# Patient Record
Sex: Male | Born: 1985
Health system: Southern US, Community
[De-identification: ages and names within clinical notes are randomized; demographics above are authoritative.]

## PROBLEM LIST (undated history)

## (undated) DIAGNOSIS — R42 Dizziness and giddiness: Secondary | ICD-10-CM

## (undated) DIAGNOSIS — I10 Essential (primary) hypertension: Secondary | ICD-10-CM

## (undated) HISTORY — DX: Dizziness and giddiness: R42

## (undated) HISTORY — DX: Essential (primary) hypertension: I10

---

## 2014-06-14 HISTORY — PX: PILONIDAL CYST EXCISION: SHX744

## 2017-01-27 DIAGNOSIS — Z6831 Body mass index (BMI) 31.0-31.9, adult: Secondary | ICD-10-CM | POA: Diagnosis not present

## 2017-01-27 DIAGNOSIS — I10 Essential (primary) hypertension: Secondary | ICD-10-CM | POA: Diagnosis not present

## 2017-02-28 DIAGNOSIS — F17210 Nicotine dependence, cigarettes, uncomplicated: Secondary | ICD-10-CM | POA: Diagnosis not present

## 2017-02-28 DIAGNOSIS — I10 Essential (primary) hypertension: Secondary | ICD-10-CM | POA: Diagnosis not present

## 2017-06-30 DIAGNOSIS — R079 Chest pain, unspecified: Secondary | ICD-10-CM | POA: Diagnosis not present

## 2017-08-02 DIAGNOSIS — Z8249 Family history of ischemic heart disease and other diseases of the circulatory system: Secondary | ICD-10-CM | POA: Diagnosis not present

## 2017-08-02 DIAGNOSIS — R5383 Other fatigue: Secondary | ICD-10-CM | POA: Diagnosis not present

## 2017-08-02 DIAGNOSIS — Z720 Tobacco use: Secondary | ICD-10-CM | POA: Diagnosis not present

## 2017-08-02 DIAGNOSIS — I10 Essential (primary) hypertension: Secondary | ICD-10-CM | POA: Diagnosis not present

## 2017-08-12 DIAGNOSIS — R5383 Other fatigue: Secondary | ICD-10-CM | POA: Diagnosis not present

## 2017-08-12 DIAGNOSIS — Z8249 Family history of ischemic heart disease and other diseases of the circulatory system: Secondary | ICD-10-CM | POA: Diagnosis not present

## 2017-08-12 DIAGNOSIS — I10 Essential (primary) hypertension: Secondary | ICD-10-CM | POA: Diagnosis not present

## 2017-08-24 ENCOUNTER — Encounter: Payer: Self-pay | Admitting: *Deleted

## 2017-09-08 DIAGNOSIS — I10 Essential (primary) hypertension: Secondary | ICD-10-CM | POA: Diagnosis not present

## 2017-09-08 DIAGNOSIS — R42 Dizziness and giddiness: Secondary | ICD-10-CM | POA: Diagnosis not present

## 2017-09-08 DIAGNOSIS — R0789 Other chest pain: Secondary | ICD-10-CM | POA: Diagnosis not present

## 2017-09-16 DIAGNOSIS — I10 Essential (primary) hypertension: Secondary | ICD-10-CM | POA: Diagnosis not present

## 2017-09-16 DIAGNOSIS — R42 Dizziness and giddiness: Secondary | ICD-10-CM | POA: Diagnosis not present

## 2017-09-29 DIAGNOSIS — R079 Chest pain, unspecified: Secondary | ICD-10-CM | POA: Diagnosis not present

## 2017-09-29 DIAGNOSIS — I10 Essential (primary) hypertension: Secondary | ICD-10-CM | POA: Diagnosis not present

## 2017-09-29 DIAGNOSIS — R42 Dizziness and giddiness: Secondary | ICD-10-CM | POA: Diagnosis not present

## 2017-10-11 ENCOUNTER — Encounter: Payer: Self-pay | Admitting: *Deleted

## 2017-10-11 ENCOUNTER — Encounter: Payer: Self-pay | Admitting: Neurology

## 2017-10-11 ENCOUNTER — Encounter

## 2017-10-11 ENCOUNTER — Ambulatory Visit (INDEPENDENT_AMBULATORY_CARE_PROVIDER_SITE_OTHER): Payer: BLUE CROSS/BLUE SHIELD | Admitting: Neurology

## 2017-10-11 DIAGNOSIS — R42 Dizziness and giddiness: Secondary | ICD-10-CM | POA: Insufficient documentation

## 2017-10-11 MED ORDER — NORTRIPTYLINE HCL 25 MG PO CAPS: 50.0000 mg | ORAL_CAPSULE | Freq: Every day | ORAL | 11 refills | Status: DC

## 2017-10-11 NOTE — Progress Notes (Signed)
PATIENT: Nathan Warner DOB: 10-18-1985  Chief Complaint  Patient presents with  . Dizziness    Orthostatic Vitals:  Lying: 129/81, 76, Sitting: 149/89, 71, Standing: 146/89, 67, Standing x 3 mintues: 147/90, 72.  He is here with his wife, Nathan Warner.  Reports intermittent dizziness/lightheadedness since January 2019.  He has been doing the Epley Maneuver over the last week which has helped.  He only had mild relief with meclizine.  Marland Kitchen PCP    Via, Caryn Bee, MD     HISTORICAL  Nathan Warner is a 32 years old male, seen in refer by his primary care physician Nathan Warner, for evaluation of dizziness, initial evaluation was on April /30/2019.  He has past medical history of hypertension, is taking amlodipine 2.5 mg daily since April 2019.   He began to notice constellation of symptoms since September 2018, initially lightheaded, pressure sensation in his left chest, going down his left arm, he was diagnosed with hypertension was treated with hydrochlorothiazide, but by January 2019, he continued to be symptomatic, in addition he began to develop more almost daily constant low-grade dizziness, described as lightheaded sensation, occasionally feels spinning, has to set down resting, otherwise highly function, workout regularly, work, driving, continue his daily activity without limitations.  In addition, he complains of mild bilateral frontal pressure headaches, independent of his complaints of dizziness, he denies passing out, denies hearing loss,  Hydrochlorothiazide was stopped temporarily in April for 2 weeks, he felt great, but symptoms returned, he was started on amlodipine 2.5 mg since early April 2019, he did not feel a difference, remain symptomatic   Laboratory evaluation March 2019, normal sodium 138, hemoglobin of 15.7, CMP, with exception of mild elevated glucose 100, LDL of 83, total cholesterol of 143, TSH 1.72.   REVIEW OF SYSTEMS: Full 14 system review of systems performed and  notable only for anxiety, dizziness, headaches, blurred vision, spinning sensation, chest pain  ALLERGIES: No Known Allergies  HOME MEDICATIONS: Current Outpatient Medications  Medication Sig Dispense Refill  . amLODipine (NORVASC) 2.5 MG tablet Take 2.5 mg by mouth daily.  2  . omeprazole (PRILOSEC) 20 MG capsule Take 20 mg by mouth daily.  1   No current facility-administered medications for this visit.     PAST MEDICAL HISTORY: Past Medical History:  Diagnosis Date  . Hypertension   . Lightheadedness     PAST SURGICAL HISTORY: Past Surgical History:  Procedure Laterality Date  . PILONIDAL CYST EXCISION  2016    FAMILY HISTORY: Family History  Problem Relation Age of Onset  . Diabetes Mother   . Heart failure Mother   . Hypertension Father   . Skin cancer Father     SOCIAL HISTORY:  Social History   Socioeconomic History  . Marital status: Married    Spouse name: Not on file  . Number of children: 0  . Years of education: 33  . Highest education level: Master's degree (e.g., MA, MS, MEng, MEd, MSW, MBA)  Occupational History  . Occupation: Art gallery manager    Comment: [employer]   Social Needs  . Financial resource strain: Not on file  . Food insecurity:    Worry: Not on file    Inability: Not on file  . Transportation needs:    Medical: Not on file    Non-medical: Not on file  Tobacco Use  . Smoking status: Former Smoker    Types: Cigarettes    Last attempt to quit: 06/2017    Years since  quitting: 0.3  . Smokeless tobacco: Never Used  Substance and Sexual Activity  . Alcohol use: Yes    Comment: 10-15 beer/glass of wine per week  . Drug use: Never  . Sexual activity: Not on file  Lifestyle  . Physical activity:    Days per week: Not on file    Minutes per session: Not on file  . Stress: Not on file  Relationships  . Social connections:    Talks on phone: Not on file    Gets together: Not on file    Attends religious service: Not on file    Active  member of club or organization: Not on file    Attends meetings of clubs or organizations: Not on file    Relationship status: Not on file  . Intimate partner violence:    Fear of current or ex partner: Not on file    Emotionally abused: Not on file    Physically abused: Not on file    Forced sexual activity: Not on file  Other Topics Concern  . Not on file  Social History Narrative   Lives at home with his wife.   Right-handed.   1- cups coffee per day, occasional soda.     PHYSICAL EXAM   Vitals:   10/11/17 1007  Weight: 187 lb 12 oz (85.2 kg)  Height:  (1.676 m)    Not recorded      Body mass index is 30.3 kg/m.  PHYSICAL EXAMNIATION:  Gen: NAD, conversant, well nourised, obese, well groomed                     Cardiovascular: Regular rate rhythm, no peripheral edema, warm, nontender. Eyes: Conjunctivae clear without exudates or hemorrhage Neck: Supple, no carotid bruits. Pulmonary: Clear to auscultation bilaterally   NEUROLOGICAL EXAM:  MENTAL STATUS: Speech:    Speech is normal; fluent and spontaneous with normal comprehension.  Cognition:     Orientation to time, place and person     Normal recent and remote memory     Normal Attention span and concentration     Normal Language, naming, repeating,spontaneous speech     Fund of knowledge   CRANIAL NERVES: CN II: Visual fields are full to confrontation. Fundoscopic exam is normal with sharp discs and no vascular changes. Pupils are round equal and briskly reactive to light. CN III, IV, VI: extraocular movement are normal. No ptosis. CN V: Facial sensation is intact to pinprick in all 3 divisions bilaterally. Corneal responses are intact.  CN VII: Face is symmetric with normal eye closure and smile. CN VIII: Hearing is normal to rubbing fingers CN IX, X: Palate elevates symmetrically. Phonation is normal. CN XI: Head turning and shoulder shrug are intact CN XII: Tongue is midline with normal  movements and no atrophy.  MOTOR: There is no pronator drift of out-stretched arms. Muscle bulk and tone are normal. Muscle strength is normal.  REFLEXES: Reflexes are 2+ and symmetric at the biceps, triceps, knees, and ankles. Plantar responses are flexor.  SENSORY: Intact to light touch, pinprick, positional sensation and vibratory sensation are intact in fingers and toes.  COORDINATION: Rapid alternating movements and fine finger movements are intact. There is no dysmetria on finger-to-nose and heel-knee-shin.    GAIT/STANCE: Posture is normal. Gait is steady with normal steps, base, arm swing, and turning. Heel and toe walking are normal. Tandem gait is normal.  Romberg is absent.   DIAGNOSTIC DATA (LABS, IMAGING, TESTING) -  I reviewed patient records, labs, notes, testing and imaging myself where available.   ASSESSMENT AND PLAN  Nathan Warner is a 32 y.o. male   Dizziness Chronic headaches Normal neurological examinations,  Extensive laboratory evaluations failed to demonstrate etiology He complains of anxiety, will try low-dose of nortriptyline 25 mg titrating to 50 mg every night   Nathan Warner, M.D. Ph.D.  [facility] [address] [place], [place] [address] Ph: [phone] Fax: [phone]  CC: Iva Boop, MD

## 2017-10-12 ENCOUNTER — Telehealth: Payer: Self-pay | Admitting: Neurology

## 2017-10-12 NOTE — Telephone Encounter (Signed)
Patient is calling.  He started Nortriptyline yesterday, went to gym today and his heart rate is very high

## 2017-10-12 NOTE — Telephone Encounter (Signed)
Per Dr. Terrace Arabia, stop nortriptyline.  She is not going to start another medication at this time.  She suggested just monitoring the symptoms.  He is agreeable to this plan. Stated he was already feeling better this afternoon and his heart rate had improved. He declined to make a follow up at this time.  He has an appt w/ his PCP in three weeks and said he would call us back if the PCP felt he needed to continue seeing a neurologist.

## 2017-10-12 NOTE — Addendum Note (Signed)
Addended by: Lindell Spar C on: 10/12/2017 01:38 PM   Modules accepted: Orders

## 2017-10-12 NOTE — Telephone Encounter (Signed)
Spoke to patient - he took his first dose of nortriptyline  last night at bedtime.  He went to gym and completed his normal workout routine this morning.  He has been at work for three hours now (sitting at his desk) and his heart rate is still up.  Initially, it was in the 120s and now is fluctuating between 85-110.

## 2017-10-18 DIAGNOSIS — I10 Essential (primary) hypertension: Secondary | ICD-10-CM | POA: Diagnosis not present

## 2017-10-18 DIAGNOSIS — R51 Headache: Secondary | ICD-10-CM | POA: Diagnosis not present

## 2017-10-18 DIAGNOSIS — R42 Dizziness and giddiness: Secondary | ICD-10-CM | POA: Diagnosis not present

## 2017-10-18 DIAGNOSIS — R202 Paresthesia of skin: Secondary | ICD-10-CM | POA: Diagnosis not present

## 2017-10-24 DIAGNOSIS — R42 Dizziness and giddiness: Secondary | ICD-10-CM | POA: Diagnosis not present

## 2017-10-24 DIAGNOSIS — R51 Headache: Secondary | ICD-10-CM | POA: Diagnosis not present

## 2017-10-24 DIAGNOSIS — Z135 Encounter for screening for eye and ear disorders: Secondary | ICD-10-CM | POA: Diagnosis not present

## 2017-11-04 DIAGNOSIS — R42 Dizziness and giddiness: Secondary | ICD-10-CM | POA: Diagnosis not present

## 2017-11-04 DIAGNOSIS — R0789 Other chest pain: Secondary | ICD-10-CM | POA: Diagnosis not present

## 2017-11-04 DIAGNOSIS — I10 Essential (primary) hypertension: Secondary | ICD-10-CM | POA: Diagnosis not present

## 2017-11-14 DIAGNOSIS — R0609 Other forms of dyspnea: Secondary | ICD-10-CM | POA: Diagnosis not present

## 2017-11-22 DIAGNOSIS — R079 Chest pain, unspecified: Secondary | ICD-10-CM | POA: Diagnosis not present

## 2017-12-05 ENCOUNTER — Ambulatory Visit: Payer: Self-pay | Admitting: Family Medicine

## 2017-12-06 DIAGNOSIS — J320 Chronic maxillary sinusitis: Secondary | ICD-10-CM | POA: Diagnosis not present

## 2017-12-06 DIAGNOSIS — H9313 Tinnitus, bilateral: Secondary | ICD-10-CM | POA: Diagnosis not present

## 2017-12-06 DIAGNOSIS — H8143 Vertigo of central origin, bilateral: Secondary | ICD-10-CM | POA: Diagnosis not present

## 2017-12-06 DIAGNOSIS — J301 Allergic rhinitis due to pollen: Secondary | ICD-10-CM | POA: Diagnosis not present

## 2017-12-06 DIAGNOSIS — H6121 Impacted cerumen, right ear: Secondary | ICD-10-CM | POA: Diagnosis not present

## 2017-12-06 DIAGNOSIS — J3081 Allergic rhinitis due to animal (cat) (dog) hair and dander: Secondary | ICD-10-CM | POA: Diagnosis not present

## 2017-12-23 DIAGNOSIS — J040 Acute laryngitis: Secondary | ICD-10-CM | POA: Diagnosis not present

## 2017-12-23 DIAGNOSIS — J320 Chronic maxillary sinusitis: Secondary | ICD-10-CM | POA: Diagnosis not present

## 2017-12-23 DIAGNOSIS — J410 Simple chronic bronchitis: Secondary | ICD-10-CM | POA: Diagnosis not present

## 2017-12-23 DIAGNOSIS — J322 Chronic ethmoidal sinusitis: Secondary | ICD-10-CM | POA: Diagnosis not present

## 2017-12-27 DIAGNOSIS — J322 Chronic ethmoidal sinusitis: Secondary | ICD-10-CM | POA: Diagnosis not present

## 2017-12-27 DIAGNOSIS — H8143 Vertigo of central origin, bilateral: Secondary | ICD-10-CM | POA: Diagnosis not present

## 2017-12-27 DIAGNOSIS — J320 Chronic maxillary sinusitis: Secondary | ICD-10-CM | POA: Diagnosis not present

## 2017-12-27 DIAGNOSIS — J301 Allergic rhinitis due to pollen: Secondary | ICD-10-CM | POA: Diagnosis not present

## 2018-01-10 DIAGNOSIS — H9041 Sensorineural hearing loss, unilateral, right ear, with unrestricted hearing on the contralateral side: Secondary | ICD-10-CM | POA: Diagnosis not present

## 2018-01-10 DIAGNOSIS — H8141 Vertigo of central origin, right ear: Secondary | ICD-10-CM | POA: Diagnosis not present

## 2018-02-01 DIAGNOSIS — I10 Essential (primary) hypertension: Secondary | ICD-10-CM | POA: Diagnosis not present

## 2018-02-01 DIAGNOSIS — R202 Paresthesia of skin: Secondary | ICD-10-CM | POA: Diagnosis not present

## 2018-04-12 DIAGNOSIS — G43909 Migraine, unspecified, not intractable, without status migrainosus: Secondary | ICD-10-CM | POA: Diagnosis not present

## 2018-04-12 DIAGNOSIS — H8111 Benign paroxysmal vertigo, right ear: Secondary | ICD-10-CM | POA: Diagnosis not present

## 2018-04-12 DIAGNOSIS — H9041 Sensorineural hearing loss, unilateral, right ear, with unrestricted hearing on the contralateral side: Secondary | ICD-10-CM | POA: Diagnosis not present

## 2018-04-12 DIAGNOSIS — H8101 Meniere's disease, right ear: Secondary | ICD-10-CM | POA: Diagnosis not present

## 2018-06-01 DIAGNOSIS — Z79899 Other long term (current) drug therapy: Secondary | ICD-10-CM | POA: Diagnosis not present

## 2018-06-01 DIAGNOSIS — K219 Gastro-esophageal reflux disease without esophagitis: Secondary | ICD-10-CM | POA: Diagnosis not present

## 2018-06-01 DIAGNOSIS — R42 Dizziness and giddiness: Secondary | ICD-10-CM | POA: Diagnosis not present

## 2018-06-01 DIAGNOSIS — I10 Essential (primary) hypertension: Secondary | ICD-10-CM | POA: Diagnosis not present

## 2018-06-12 DIAGNOSIS — Z049 Encounter for examination and observation for unspecified reason: Secondary | ICD-10-CM | POA: Diagnosis not present

## 2018-06-12 DIAGNOSIS — G44229 Chronic tension-type headache, not intractable: Secondary | ICD-10-CM | POA: Diagnosis not present

## 2019-03-18 DIAGNOSIS — Z20828 Contact with and (suspected) exposure to other viral communicable diseases: Secondary | ICD-10-CM | POA: Diagnosis not present

## 2019-04-16 DIAGNOSIS — M79674 Pain in right toe(s): Secondary | ICD-10-CM | POA: Diagnosis not present

## 2019-04-18 DIAGNOSIS — M79674 Pain in right toe(s): Secondary | ICD-10-CM | POA: Diagnosis not present

## 2019-08-08 DIAGNOSIS — M79674 Pain in right toe(s): Secondary | ICD-10-CM | POA: Diagnosis not present

## 2019-08-20 ENCOUNTER — Encounter: Payer: Self-pay | Admitting: Family Medicine

## 2019-08-20 ENCOUNTER — Ambulatory Visit (INDEPENDENT_AMBULATORY_CARE_PROVIDER_SITE_OTHER): Payer: BC Managed Care – PPO | Admitting: Family Medicine

## 2019-08-20 ENCOUNTER — Ambulatory Visit: Payer: Self-pay

## 2019-08-20 ENCOUNTER — Other Ambulatory Visit: Payer: Self-pay

## 2019-08-20 DIAGNOSIS — M79674 Pain in right toe(s): Secondary | ICD-10-CM

## 2019-08-20 MED ORDER — DICLOFENAC SODIUM 1 % EX GEL: 4.0000 g | Freq: Four times a day (QID) | CUTANEOUS | 6 refills | Status: AC | PRN

## 2019-08-20 MED ORDER — DICLOFENAC SODIUM 75 MG PO TBEC: 75.0000 mg | DELAYED_RELEASE_TABLET | Freq: Two times a day (BID) | ORAL | 3 refills | Status: AC | PRN

## 2019-08-20 NOTE — Progress Notes (Signed)
Office Visit Note   Patient: Nathan Warner           Date of Birth: [birth year]           MRN: [id number] Visit Date: 08/20/2019 Requested by: Dineen Kid, MD [place],  [place] [address] PCP: Dineen Kid, MD  Subjective: Chief Complaint  Patient presents with  . Left Foot - Pain    Pain around first metatarsal - started 2 days after stubbing toe late Oct/early Nov 2020. Has been to an orthopedist, but the patient was not pleased with how he was treated. Xrays were normal, along with blood work.    HPI: He is here with right great toe pain.  About 3 or 4 months ago he stubbed his toe against something while working in the garage.  He did not have much pain right away, but about 3 days later it started to hurt and swell so he went to his PCP who took some x-rays and felt that he might have a fracture.  He was referred to orthopedics and was told that it was not a fracture but could be gout.  He was given indomethacin but it did not help.  He has continued to have pain with certain activities.  He walks with a slight limp.  Pain is in the great toe MTP joint.  It hurts mostly on the medial side.  No previous problems with his toe.  He is otherwise been in good health.  No personal or family history of gout.  He had labs showing a normal uric acid level.  He recently had follow-up x-rays which were reportedly normal.  I do not have any x-rays available for review.              ROS: No fevers or chills.  All other systems were reviewed and are negative.  Objective: Vital Signs: There were no vitals taken for this visit.  Physical Exam:  General:  Alert and oriented, in no acute distress. Pulm:  Breathing unlabored. Psy:  Normal mood, congruent affect. Skin: No erythema or rash. Right foot: His great toe MTP joint is tender to palpation on the dorsal and medial aspect.  There is very slight swelling but no warmth.  Dorsiflexion is about 45 degrees before it causes pain.  Flexor  and extensor mechanisms are intact.  Imaging: Limited diagnostic ultrasound right great toe: The MTP joint has some synovial thickening but no distinct effusion, no hyperemia on power Doppler imaging.  I question whether he has a double contour sign on the dorsal aspect of the distal first metatarsal articular cartilage.  Assessment & Plan: 1.  Persistent right first MTP pain approximately 3 months status post contusion, possible occult fracture versus sprain with persistent synovitis. -We will try combination of anti-inflammatories, vitamin D3, vitamin K2 and magnesium. -If after 3 to 4 weeks he is not improving, we might do a one-time cortisone injection.  If that does not help, then MRI scan.     Procedures: No procedures performed  No notes on file     PMFS History: Patient Active Problem List   Diagnosis Date Noted  . Dizziness 10/11/2017   Past Medical History:  Diagnosis Date  . Hypertension   . Lightheadedness     Family History  Problem Relation Age of Onset  . Diabetes Mother   . Heart failure Mother   . Hypertension Father   . Skin cancer Father     Past Surgical  History:  Procedure Laterality Date  . PILONIDAL CYST EXCISION  2016   Social History   Occupational History  . Occupation: Art gallery manager    Comment: [employer]   Tobacco Use  . Smoking status: Former Smoker    Types: Cigarettes    Quit date: 06/2017    Years since quitting: 2.1  . Smokeless tobacco: Never Used  Substance and Sexual Activity  . Alcohol use: Yes    Comment: 10-15 beer/glass of wine per week  . Drug use: Never  . Sexual activity: Not on file

## 2019-08-20 NOTE — Patient Instructions (Signed)
   Vitamin D3:  5,000 IU daily  Vitamin K2:  100 mcg daily  Magnesium:  400 mg daily   

## 2019-08-23 ENCOUNTER — Ambulatory Visit: Payer: BC Managed Care – PPO

## 2019-08-23 ENCOUNTER — Ambulatory Visit: Payer: BC Managed Care – PPO | Attending: Internal Medicine

## 2019-08-23 DIAGNOSIS — Z23 Encounter for immunization: Secondary | ICD-10-CM

## 2019-08-23 NOTE — Progress Notes (Signed)
   Covid-19 Vaccination Clinic  Name:  Nathan Warner    MRN: [id number] DOB: 12/12/1985  08/23/2019  Mr. Nathan Warner was observed post Covid-19 immunization for 15 minutes without incident. He was provided with Vaccine Information Sheet and instruction to access the V-Safe system.   Mr. Nathan Warner was instructed to call 911 with any severe reactions post vaccine: Marland Kitchen Difficulty breathing  . Swelling of face and throat  . A fast heartbeat  . A bad rash all over body  . Dizziness and weakness   Immunizations Administered    Name Date Dose VIS Date Route   Pfizer COVID-19 Vaccine 08/23/2019  8:48 AM 0.3 mL 05/25/2019 Intramuscular   Manufacturer: [employer], [employer]   Lot: [id number]   NDC: [address]-8

## 2019-09-17 ENCOUNTER — Ambulatory Visit: Payer: BC Managed Care – PPO | Attending: Internal Medicine

## 2019-09-17 DIAGNOSIS — Z23 Encounter for immunization: Secondary | ICD-10-CM

## 2019-09-17 NOTE — Progress Notes (Signed)
   Covid-19 Vaccination Clinic  Name:  Nathan Warner    MRN: [id number] DOB: 03-19-1986  09/17/2019  [name] was observed post Covid-19 immunization for 15 minutes without incident. He was provided with Vaccine Information Sheet and instruction to access the V-Safe system.   [name] was instructed to call 911 with any severe reactions post vaccine: Marland Kitchen Difficulty breathing  . Swelling of face and throat  . A fast heartbeat  . A bad rash all over body  . Dizziness and weakness   Immunizations Administered    Name Date Dose VIS Date Route   Pfizer COVID-19 Vaccine 09/17/2019  8:52 AM 0.3 mL 05/25/2019 Intramuscular   Manufacturer: [employer], [employer]   Lot: [id number]   NDC: [address]-6

## 2019-10-18 ENCOUNTER — Ambulatory Visit: Payer: Self-pay

## 2019-10-18 ENCOUNTER — Other Ambulatory Visit: Payer: Self-pay

## 2019-10-18 ENCOUNTER — Encounter: Payer: Self-pay | Admitting: Family Medicine

## 2019-10-18 ENCOUNTER — Ambulatory Visit (INDEPENDENT_AMBULATORY_CARE_PROVIDER_SITE_OTHER): Payer: BC Managed Care – PPO | Admitting: Family Medicine

## 2019-10-18 DIAGNOSIS — M79674 Pain in right toe(s): Secondary | ICD-10-CM

## 2019-10-18 MED ORDER — TRAMADOL HCL 50 MG PO TABS: 50.0000 mg | ORAL_TABLET | Freq: Four times a day (QID) | ORAL | 0 refills | Status: AC | PRN

## 2019-10-18 NOTE — Progress Notes (Signed)
   Office Visit Note   Patient: Nathan Warner           Date of Birth: [birth year]           MRN: [id number] Visit Date: 10/18/2019 Requested by: No referring provider defined for this encounter. PCP: Patient, No Pcp Per  Subjective: Chief Complaint  Patient presents with  . Right Great Toe - Pain    Pain went away x month, after taking prescribed medicine at last ov in March. Flared back up after working on the floor, kneeling.    HPI: Some right great toe pain.  Since last visit he took diclofenac and his pain improved, but the stiffness remains.  Then recently he was working on some floors at home and afterward his toe became red and swollen again.  He is frustrated by his ongoing symptoms.              ROS: No fever or chills.  All other systems were reviewed and are negative.  Objective: Vital Signs: There were no vitals taken for this visit.  Physical Exam:  General:  Alert and oriented, in no acute distress. Pulm:  Breathing unlabored. Psy:  Normal mood, congruent affect. Skin: There is some erythema on the medial side of his first MTP joint.  It is slightly warm to the touch. Right great toe: He has tenderness to palpation of the medial side of the first MTP.  Slightly limited range of motion with dorsiflexion.  Imaging: US Guided Needle Placement - No Linked Charges  Result Date: 10/18/2019 Ultrasound-guided right first MTP injection: I briefly image the dorsal medial MTP joint and there is slight hyperemia on power Doppler imaging with thickening of the joint capsule, I question whether he might have a double contour sign at the distal first metatarsal.  Injected 2 cc 1% lidocaine without epinephrine and 20 mg methylprednisolone into the dorsal medial joint recess.   Assessment & Plan: 1.  Persistent right first MTP pain, possibilities include crystal arthropathy, fracture nonunion. -We discussed options and elected to inject with cortisone under ultrasound guidance.  If  he fails to improve, then MRI scan.     Procedures: No procedures performed  No notes on file     PMFS History: Patient Active Problem List   Diagnosis Date Noted  . Dizziness 10/11/2017   Past Medical History:  Diagnosis Date  . Hypertension   . Lightheadedness     Family History  Problem Relation Age of Onset  . Diabetes Mother   . Heart failure Mother   . Hypertension Father   . Skin cancer Father     Past Surgical History:  Procedure Laterality Date  . PILONIDAL CYST EXCISION  2016   Social History   Occupational History  . Occupation: Art gallery manager    Comment: [employer]   Tobacco Use  . Smoking status: Former Smoker    Types: Cigarettes    Quit date: 06/2017    Years since quitting: 2.3  . Smokeless tobacco: Never Used  Substance and Sexual Activity  . Alcohol use: Yes    Comment: 10-15 beer/glass of wine per week  . Drug use: Never  . Sexual activity: Not on file

## 2019-12-12 ENCOUNTER — Encounter: Payer: Self-pay | Admitting: Family Medicine

## 2019-12-12 ENCOUNTER — Other Ambulatory Visit: Payer: Self-pay

## 2019-12-12 ENCOUNTER — Ambulatory Visit (INDEPENDENT_AMBULATORY_CARE_PROVIDER_SITE_OTHER): Payer: BC Managed Care – PPO | Admitting: Family Medicine

## 2019-12-12 DIAGNOSIS — M79674 Pain in right toe(s): Secondary | ICD-10-CM | POA: Diagnosis not present

## 2019-12-12 MED ORDER — COLCHICINE 0.6 MG PO CAPS: 1.0000 | ORAL_CAPSULE | Freq: Two times a day (BID) | ORAL | 3 refills | Status: AC | PRN

## 2019-12-12 NOTE — Progress Notes (Signed)
Office Visit Note   Patient: Nathan Warner           Date of Birth: 1986-05-04           MRN: [id number] Visit Date: 12/12/2019 Requested by: No referring provider defined for this encounter. PCP: Patient, No Pcp Per  Subjective: Chief Complaint  Patient presents with  . Right Foot - Pain    Pain in the first MTP joint had gotten better after cortisone injection 10/18/19. Pain returned yesterday, after much work in around cars [employer]). Did take indomethacin, which eased the throbbing. The area is red today.    HPI: He is here with recurrent right great toe pain.  The injection gave him excellent relief lasting for about 2 months.  He was not totally back to normal, he still felt some stiffness in his toe, but it was not hurting anymore.  A couple days ago he had a project at work which involved a lot of physical activity.  By the end of the day his toe was throbbing, red and swollen.  He took some indomethacin and his pain is better, but he is still walking with a limp.               ROS:   All other systems were reviewed and are negative.  Objective: Vital Signs: There were no vitals taken for this visit.  Physical Exam:  General:  Alert and oriented, in no acute distress. Pulm:  Breathing unlabored. Psy:  Normal mood, congruent affect. Skin: There is some erythema on the medial side of his first MTP joint of the right great toe. Right foot: Tender and a small effusion at the first MTP joint.  The IP joint does not hurt.  He has pain with dorsiflexion.  Imaging: No results found.  Assessment & Plan: 1.  Recurrent right first MTP pain, suspicious for gout. -We discussed options, elected to treat with colchicine and indomethacin.  If not significantly improved by Friday, he will come back and we will attempt to aspirate and send the fluid for crystal analysis.  Could do 1 more cortisone injection at that point. -If colchicine helps, then we will draw a uric acid level when  he is feeling better to see what his baseline level is and decide whether to consider preventive medication such as allopurinol.     Procedures: No procedures performed  No notes on file     PMFS History: Patient Active Problem List   Diagnosis Date Noted  . Dizziness 10/11/2017   Past Medical History:  Diagnosis Date  . Hypertension   . Lightheadedness     Family History  Problem Relation Age of Onset  . Diabetes Mother   . Heart failure Mother   . Hypertension Father   . Skin cancer Father     Past Surgical History:  Procedure Laterality Date  . PILONIDAL CYST EXCISION  2016   Social History   Occupational History  . Occupation: Art gallery manager    Comment: [employer]   Tobacco Use  . Smoking status: Former Smoker    Types: Cigarettes    Quit date: 06/2017    Years since quitting: 2.4  . Smokeless tobacco: Never Used  Vaping Use  . Vaping Use: Never used  Substance and Sexual Activity  . Alcohol use: Yes    Comment: 10-15 beer/glass of wine per week  . Drug use: Never  . Sexual activity: Not on file

## 2020-01-07 ENCOUNTER — Other Ambulatory Visit: Payer: Self-pay

## 2020-01-07 ENCOUNTER — Encounter: Payer: Self-pay | Admitting: Family Medicine

## 2020-01-07 ENCOUNTER — Ambulatory Visit (INDEPENDENT_AMBULATORY_CARE_PROVIDER_SITE_OTHER): Payer: BC Managed Care – PPO

## 2020-01-07 DIAGNOSIS — M79674 Pain in right toe(s): Secondary | ICD-10-CM

## 2020-01-07 NOTE — Progress Notes (Signed)
Lab visit only: uric acid drawn per Dr. Prince Rome.

## 2020-01-08 ENCOUNTER — Telehealth: Payer: Self-pay | Admitting: Family Medicine

## 2020-01-08 DIAGNOSIS — M79674 Pain in right toe(s): Secondary | ICD-10-CM

## 2020-01-08 LAB — URIC ACID: Uric Acid, Serum: 6.8 mg/dL (ref 4.0–8.0)

## 2020-01-08 NOTE — Addendum Note (Signed)
Addended by: Lillia Carmel on: 01/08/2020 11:38 AM   Modules accepted: Orders

## 2020-01-08 NOTE — Telephone Encounter (Signed)
Uric acid is 6.8.

## 2020-01-31 ENCOUNTER — Other Ambulatory Visit: Payer: Self-pay

## 2020-01-31 ENCOUNTER — Ambulatory Visit
Admission: RE | Admit: 2020-01-31 | Discharge: 2020-01-31 | Disposition: A | Discharge status: Home / Self Care | Payer: BC Managed Care – PPO | Source: Ambulatory Visit | Attending: Family Medicine | Admitting: Family Medicine

## 2020-01-31 DIAGNOSIS — M79674 Pain in right toe(s): Secondary | ICD-10-CM | POA: Diagnosis not present

## 2020-02-01 ENCOUNTER — Telehealth: Payer: Self-pay | Admitting: Family Medicine

## 2020-02-01 DIAGNOSIS — M79674 Pain in right toe(s): Secondary | ICD-10-CM

## 2020-02-01 NOTE — Addendum Note (Signed)
Addended by: Lillia Carmel on: 02/01/2020 02:38 PM   Modules accepted: Orders

## 2020-02-01 NOTE — Telephone Encounter (Signed)
MRI of right great toe looks normal.

## 2020-02-21 DIAGNOSIS — M79671 Pain in right foot: Secondary | ICD-10-CM | POA: Diagnosis not present

## 2020-03-04 DIAGNOSIS — M79671 Pain in right foot: Secondary | ICD-10-CM | POA: Diagnosis not present

## 2020-03-19 DIAGNOSIS — M79671 Pain in right foot: Secondary | ICD-10-CM | POA: Diagnosis not present

## 2020-04-26 DIAGNOSIS — J22 Unspecified acute lower respiratory infection: Secondary | ICD-10-CM | POA: Diagnosis not present

## 2020-04-26 DIAGNOSIS — R059 Cough, unspecified: Secondary | ICD-10-CM | POA: Diagnosis not present

## 2020-04-26 DIAGNOSIS — Z03818 Encounter for observation for suspected exposure to other biological agents ruled out: Secondary | ICD-10-CM | POA: Diagnosis not present

## 2021-05-15 IMAGING — MR MR FOOT*R* W/O CM
4 of 5 series · 19 of 40 positions shown · non-contrast
Comparison: Right foot x-rays dated August 08, 2019.

CLINICAL DATA: Right great toe pain for the past 8 months.

EXAM:
MRI OF THE RIGHT FOREFOOT WITHOUT CONTRAST
TECHNIQUE: Multiplanar, multisequence MR imaging of the right forefoot was
performed. No intravenous contrast was administered.

[Series 7: T1 · coronal · 3.0mm · 0.22mm/px · 3 of 44 slices shown (1 of 2)]
[im 5/44]
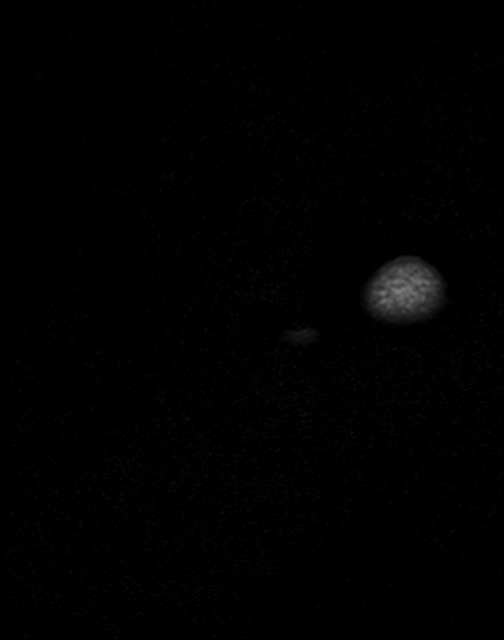
[im 22/44]
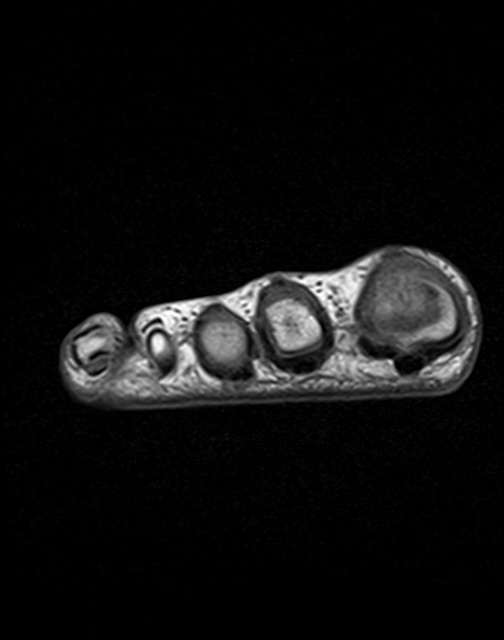
[im 39/44]
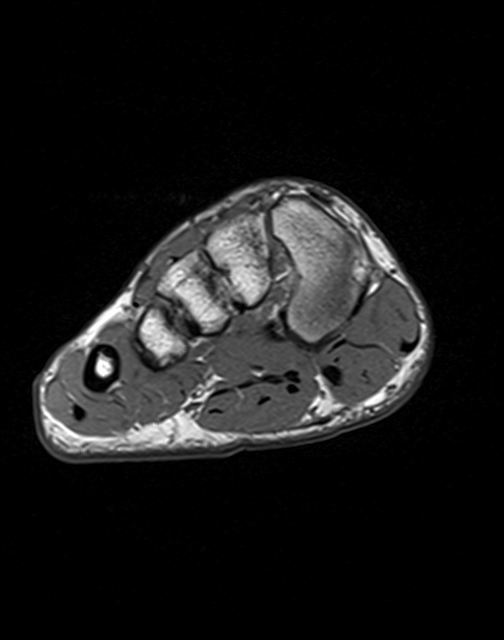

[Series 8: T2 fat-sat · coronal · 3.0mm · 0.20mm/px · 10 of 44 slices shown (1 of 2)]
[im 1/44]
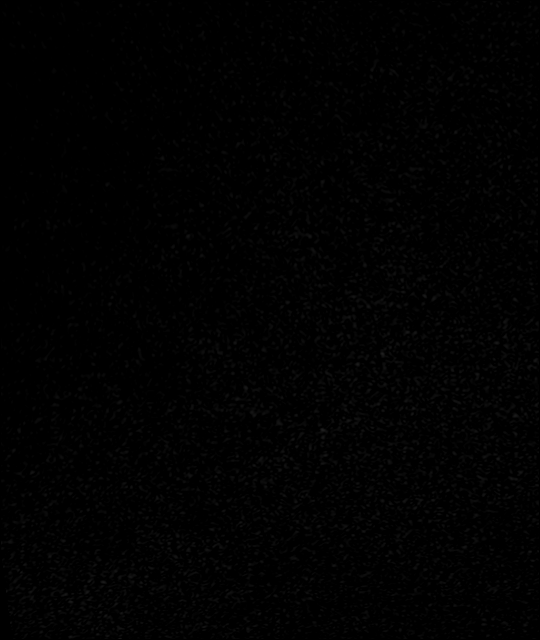
[im 5/44]
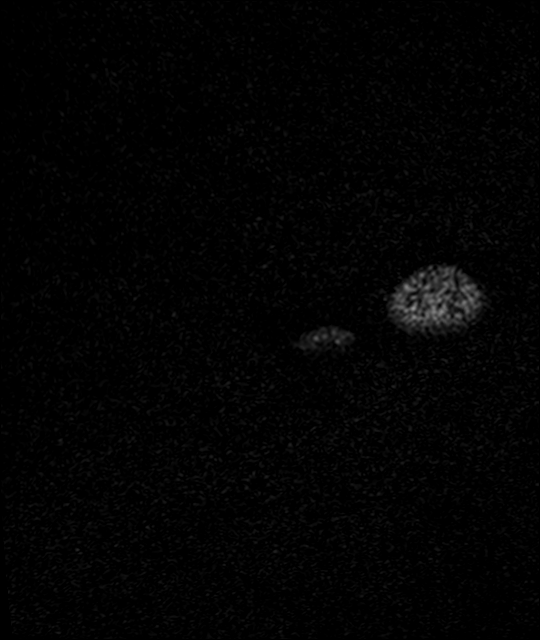
[im 9/44]
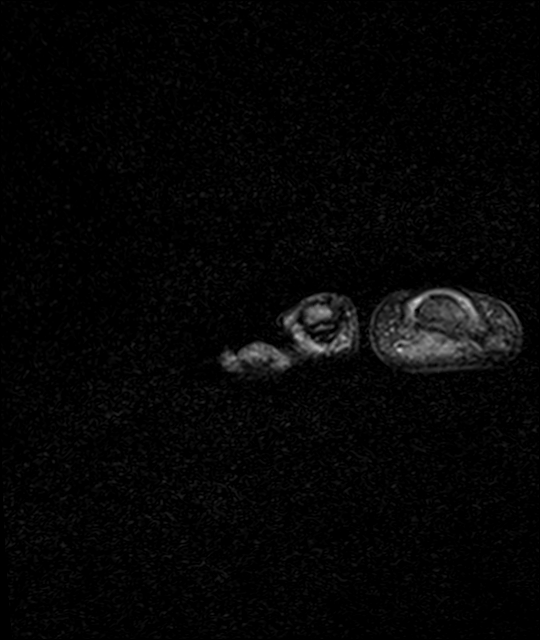
[im 13/44]
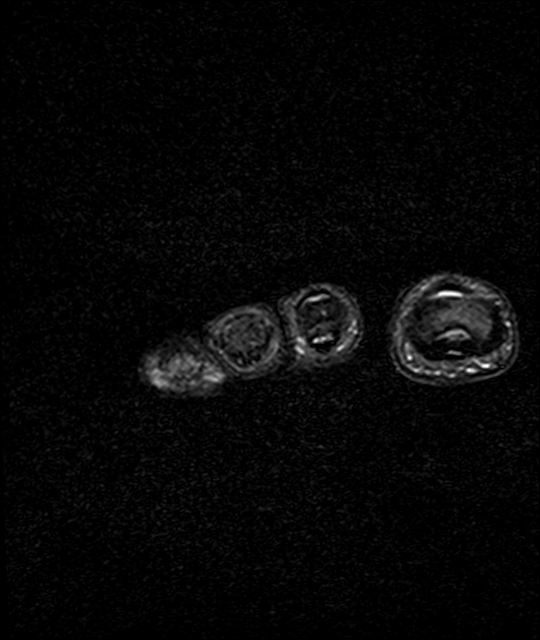
[im 18/44]
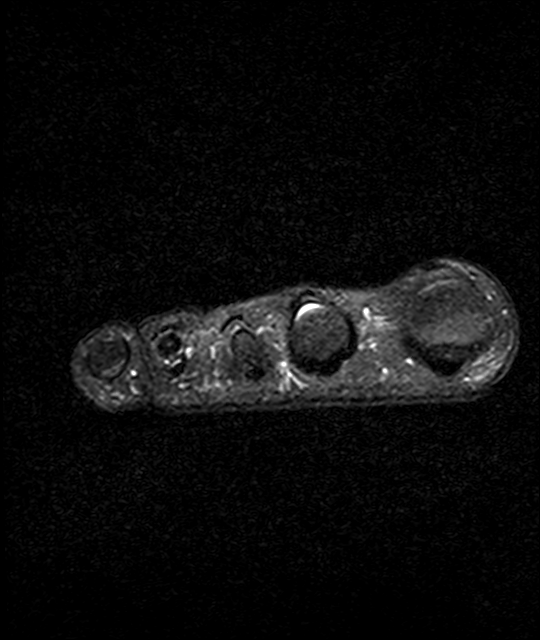
[im 22/44]
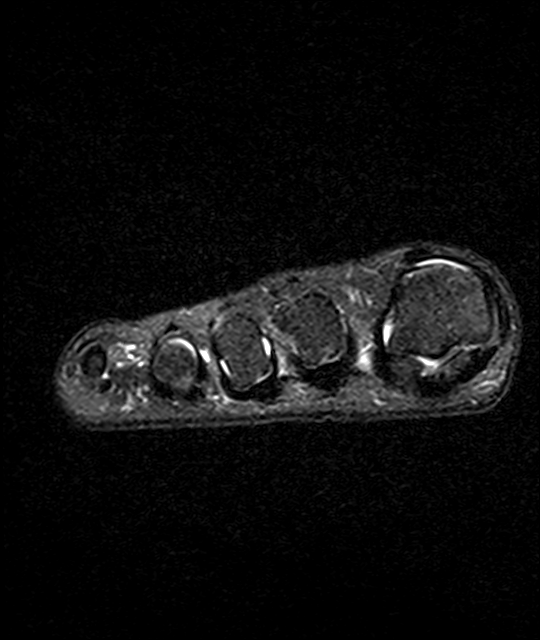
[im 26/44]
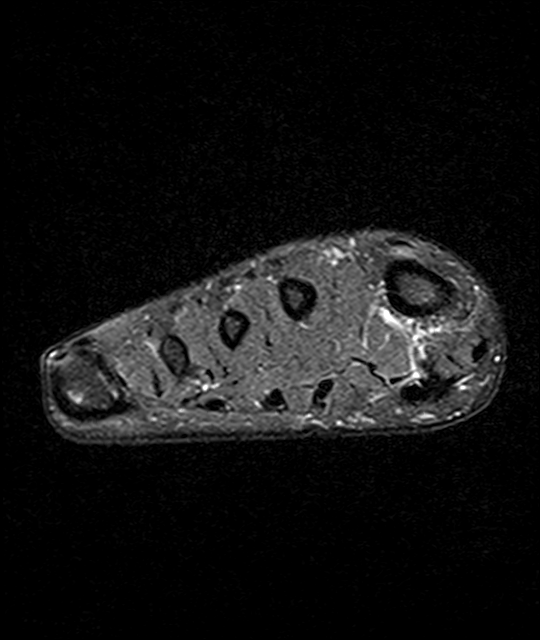
[im 31/44]
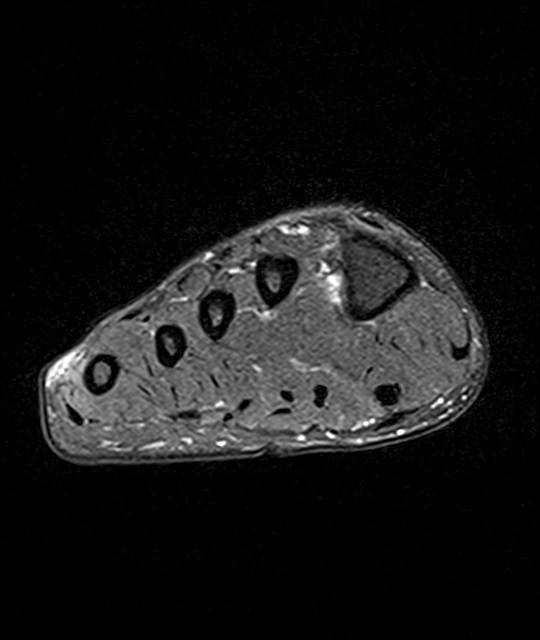
[im 35/44]
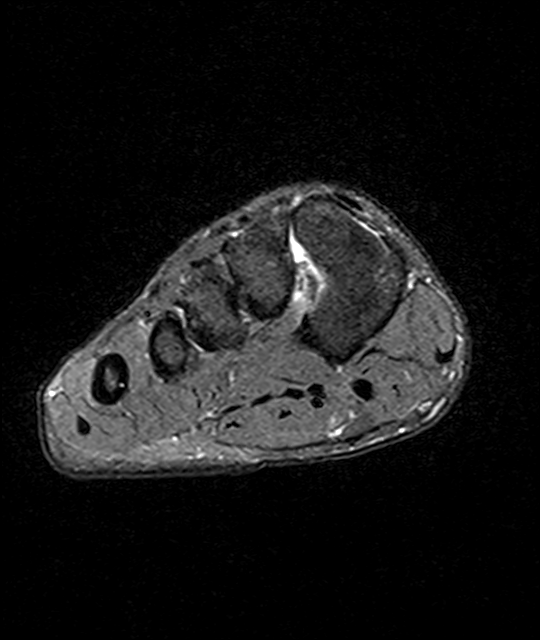
[im 39/44]
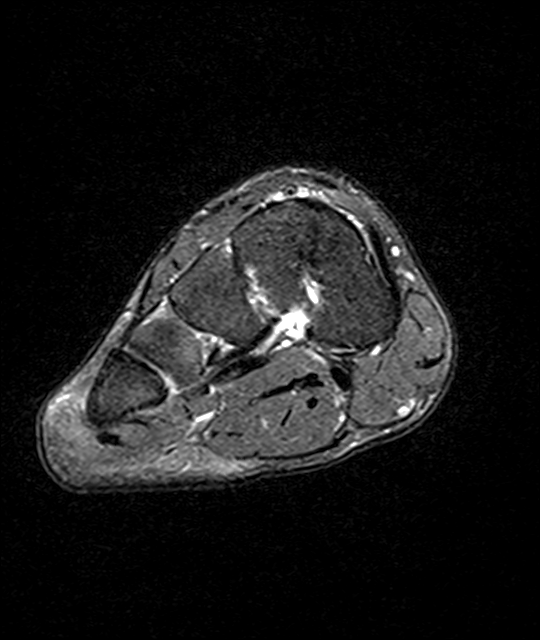

[Series 9: T2 fat-sat · axial · 3.0mm · 0.35mm/px · z∈[-128,-48]mm · 3 of 22 slices shown (2 of 2)]
[im 1/22]
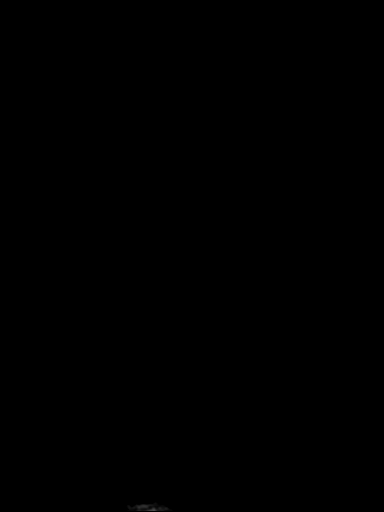
[im 11/22]
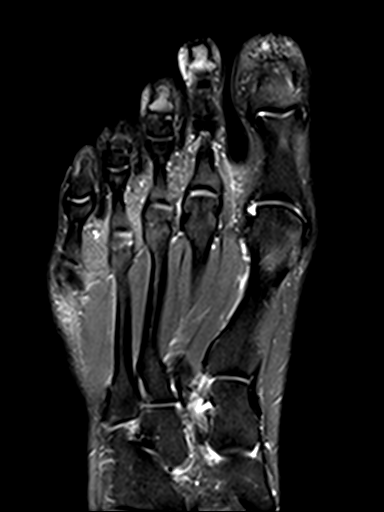
[im 22/22]
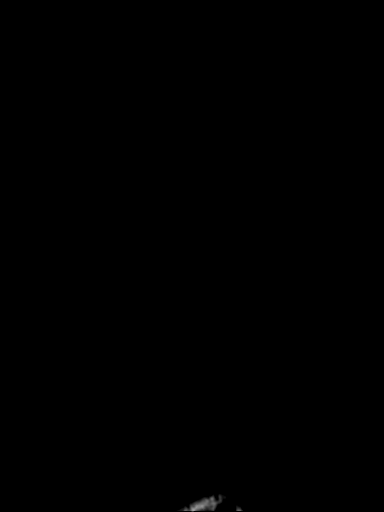

[Series 10: T1 · axial · 3.0mm · 0.70mm/px · z∈[-128,-48]mm · 3 of 22 slices shown (2 of 2)]
[im 1/22]
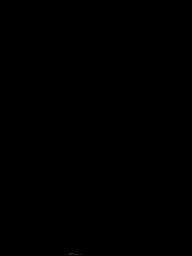
[im 11/22]
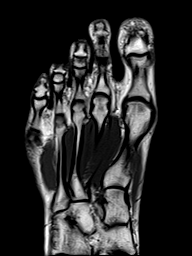
[im 22/22]
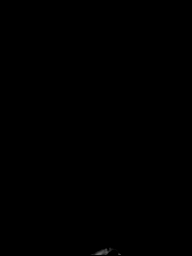

[19 of 40 positions shown; findings below may reference images not displayed]

FINDINGS: Bones/Joint/Cartilage

No marrow signal abnormality. No fracture or dislocation. Normal
alignment. No joint effusion.

Ligaments

Plantar plate is intact. Collateral ligaments are intact. Lisfranc
ligament is intact.

Muscles and Tendons
Flexor and extensor tendons are intact. No muscle edema or atrophy.

Soft tissue
No fluid collection or hematoma.  No soft tissue mass.
IMPRESSION: 1. Normal MRI of the right forefoot.

## 2021-06-03 DIAGNOSIS — Z3009 Encounter for other general counseling and advice on contraception: Secondary | ICD-10-CM | POA: Diagnosis not present

## 2021-07-31 DIAGNOSIS — Z302 Encounter for sterilization: Secondary | ICD-10-CM | POA: Diagnosis not present

## 2021-09-09 DIAGNOSIS — J020 Streptococcal pharyngitis: Secondary | ICD-10-CM | POA: Diagnosis not present

## 2021-10-21 DIAGNOSIS — J020 Streptococcal pharyngitis: Secondary | ICD-10-CM | POA: Diagnosis not present

## 2021-11-07 DIAGNOSIS — M79675 Pain in left toe(s): Secondary | ICD-10-CM | POA: Diagnosis not present

## 2021-12-01 DIAGNOSIS — M25572 Pain in left ankle and joints of left foot: Secondary | ICD-10-CM | POA: Diagnosis not present

## 2021-12-03 DIAGNOSIS — M79672 Pain in left foot: Secondary | ICD-10-CM | POA: Diagnosis not present

## 2021-12-17 DIAGNOSIS — M79675 Pain in left toe(s): Secondary | ICD-10-CM | POA: Diagnosis not present

## 2022-08-12 DIAGNOSIS — J343 Hypertrophy of nasal turbinates: Secondary | ICD-10-CM | POA: Diagnosis not present

## 2022-08-12 DIAGNOSIS — J310 Chronic rhinitis: Secondary | ICD-10-CM | POA: Diagnosis not present

## 2022-08-12 DIAGNOSIS — J342 Deviated nasal septum: Secondary | ICD-10-CM | POA: Diagnosis not present

## 2022-08-12 DIAGNOSIS — R0982 Postnasal drip: Secondary | ICD-10-CM | POA: Diagnosis not present
